# Patient Record
Sex: Female | Born: 1985 | Race: White | Hispanic: No | Smoking: Never smoker
Health system: Southern US, Community
[De-identification: ages and names within clinical notes are randomized; demographics above are authoritative.]

## PROBLEM LIST (undated history)

## (undated) DIAGNOSIS — F1320 Sedative, hypnotic or anxiolytic dependence, uncomplicated: Secondary | ICD-10-CM

## (undated) DIAGNOSIS — F419 Anxiety disorder, unspecified: Secondary | ICD-10-CM

## (undated) DIAGNOSIS — G43909 Migraine, unspecified, not intractable, without status migrainosus: Secondary | ICD-10-CM

## (undated) DIAGNOSIS — F840 Autistic disorder: Secondary | ICD-10-CM

## (undated) DIAGNOSIS — Z1589 Genetic susceptibility to other disease: Secondary | ICD-10-CM

## (undated) DIAGNOSIS — L309 Dermatitis, unspecified: Secondary | ICD-10-CM

## (undated) DIAGNOSIS — J45909 Unspecified asthma, uncomplicated: Secondary | ICD-10-CM

## (undated) DIAGNOSIS — T753XXA Motion sickness, initial encounter: Secondary | ICD-10-CM

## (undated) HISTORY — PX: COLONOSCOPY: SHX174

## (undated) HISTORY — PX: ESOPHAGOGASTRODUODENOSCOPY: SHX1529

---

## 2015-06-04 ENCOUNTER — Other Ambulatory Visit: Payer: Self-pay | Admitting: Family

## 2015-06-04 ENCOUNTER — Ambulatory Visit
Admission: RE | Admit: 2015-06-04 | Discharge: 2015-06-04 | Disposition: A | Discharge status: Home / Self Care | Payer: PRIVATE HEALTH INSURANCE | Source: Ambulatory Visit | Attending: Family | Admitting: Family

## 2015-06-04 DIAGNOSIS — M79674 Pain in right toe(s): Secondary | ICD-10-CM | POA: Diagnosis not present

## 2015-06-04 DIAGNOSIS — T1490XA Injury, unspecified, initial encounter: Secondary | ICD-10-CM

## 2016-03-13 ENCOUNTER — Encounter: Payer: Self-pay | Admitting: Physician Assistant

## 2016-03-13 ENCOUNTER — Ambulatory Visit: Payer: Self-pay | Admitting: Physician Assistant

## 2016-03-13 VITALS — BP 129/70 | HR 98 | Temp 99.1°F

## 2016-03-13 DIAGNOSIS — R112 Nausea with vomiting, unspecified: Secondary | ICD-10-CM

## 2016-03-13 DIAGNOSIS — B349 Viral infection, unspecified: Secondary | ICD-10-CM

## 2016-03-13 LAB — POCT URINALYSIS DIPSTICK
Bilirubin, UA: NEGATIVE
Glucose, UA: NEGATIVE
Ketones, UA: NEGATIVE
Leukocytes, UA: NEGATIVE
Nitrite, UA: NEGATIVE
PROTEIN UA: NEGATIVE
SPEC GRAV UA: 1.01
Urobilinogen, UA: 0.2
pH, UA: 6.5

## 2016-03-13 LAB — POCT URINE PREGNANCY: Preg Test, Ur: NEGATIVE

## 2016-03-13 MED ORDER — PROMETHAZINE HCL 25 MG PO TABS: 25.0000 mg | ORAL_TABLET | Freq: Three times a day (TID) | ORAL | Status: DC | PRN

## 2016-03-13 NOTE — Progress Notes (Signed)
S:  Pt c/o vomiting and nausea, sx for 6 days, ? fever/chills, no abd pain except for cramping with nausea; denies cp/sob, denies camping, bad food, recent antibiotics, or exposure to bad water Remainder ros neg  O:  Vitals wnl, nad, ENT wnl, neck supple no lymph, lungs c t a, cv rrr, abd soft nontender bs normal all 4 quads,  neuro intact, ua wnl, urine preg neg  A:  Viral gastroenteritis  P:  Reassurance, fluids, brat diet, rx phenergan 25mg  tid prn vomiting, return if not better in 3 days, return earlier if worsening

## 2016-04-24 ENCOUNTER — Ambulatory Visit: Payer: Self-pay | Admitting: Physician Assistant

## 2016-04-24 ENCOUNTER — Encounter: Payer: Self-pay | Admitting: Physician Assistant

## 2016-04-24 VITALS — BP 130/92 | HR 84 | Temp 98.6°F

## 2016-04-24 DIAGNOSIS — R5383 Other fatigue: Secondary | ICD-10-CM

## 2016-04-24 LAB — POCT URINALYSIS DIPSTICK
Bilirubin, UA: NEGATIVE
GLUCOSE UA: NEGATIVE
KETONES UA: NEGATIVE
Leukocytes, UA: NEGATIVE
Nitrite, UA: NEGATIVE
PROTEIN UA: NEGATIVE
SPEC GRAV UA: 1.015
Urobilinogen, UA: 0.2
pH, UA: 7.5

## 2016-04-24 LAB — POCT URINE PREGNANCY: Preg Test, Ur: NEGATIVE

## 2016-04-24 NOTE — Patient Instructions (Signed)
Fatigue  Fatigue is feeling tired all of the time, a lack of energy, or a lack of motivation. Occasional or mild fatigue is often a normal response to activity or life in general. However, long-lasting (chronic) or extreme fatigue may indicate an underlying medical condition.  HOME CARE INSTRUCTIONS   Watch your fatigue for any changes. The following actions may help to lessen any discomfort you are feeling:  · Talk to your health care provider about how much sleep you need each night. Try to get the required amount every night.  · Take medicines only as directed by your health care provider.  · Eat a healthy and nutritious diet. Ask your health care provider if you need help changing your diet.  · Drink enough fluid to keep your urine clear or pale yellow.  · Practice ways of relaxing, such as yoga, meditation, massage therapy, or acupuncture.  · Exercise regularly.    · Change situations that cause you stress. Try to keep your work and personal routine reasonable.  · Do not abuse illegal drugs.  · Limit alcohol intake to no more than 1 drink per day for nonpregnant women and 2 drinks per day for men. One drink equals 12 ounces of beer, 5 ounces of wine, or 1½ ounces of hard liquor.  · Take a multivitamin, if directed by your health care provider.  SEEK MEDICAL CARE IF:   · Your fatigue does not get better.  · You have a fever.    · You have unintentional weight loss or gain.  · You have headaches.    · You have difficulty:      Falling asleep.    Sleeping throughout the night.  · You feel angry, guilty, anxious, or sad.     · You are unable to have a bowel movement (constipation).    · You skin is dry.     · Your legs or another part of your body is swollen.    SEEK IMMEDIATE MEDICAL CARE IF:   · You feel confused.    · Your vision is blurry.  · You feel faint or pass out.    · You have a severe headache.    · You have severe abdominal, pelvic, or back pain.    · You have chest pain, shortness of breath, or an  irregular or fast heartbeat.    · You are unable to urinate or you urinate less than normal.    · You develop abnormal bleeding, such as bleeding from the rectum, vagina, nose, lungs, or nipples.  · You vomit blood.     · You have thoughts about harming yourself or committing suicide.    · You are worried that you might harm someone else.       This information is not intended to replace advice given to you by your health care provider. Make sure you discuss any questions you have with your health care provider.     Document Released: 09/07/2007 Document Revised: 12/01/2014 Document Reviewed: 03/14/2014  Elsevier Interactive Patient Education ©2016 [employer]

## 2016-04-24 NOTE — Progress Notes (Signed)
S: c/o being really tired and fatigued, doesn't want to get out of bed, states she upped her cymbalta but she's still tired, sx for about a month, lmp 03/22/16; states has irregular periods, no new activities or meds, no fever/chills/cp/sob, states she does see her psychiatrist tonight so will be able to ask her about the cympbalta  O: vitals wnl, nad, lungs c t a, cv rrr, urine preg neg, ua neg  A: fatigue  P: return for fasting labs, will do exec panel, vit d, b12, and ebv

## 2016-04-25 ENCOUNTER — Other Ambulatory Visit: Payer: Self-pay

## 2016-04-25 DIAGNOSIS — R5382 Chronic fatigue, unspecified: Secondary | ICD-10-CM

## 2016-04-26 LAB — CMP12+LP+TP+TSH+6AC+CBC/D/PLT
ALBUMIN: 4.2 g/dL (ref 3.5–5.5)
ALK PHOS: 90 IU/L (ref 39–117)
ALT: 18 IU/L (ref 0–32)
AST: 22 IU/L (ref 0–40)
Albumin/Globulin Ratio: 1.4 (ref 1.2–2.2)
BASOS: 1 %
BILIRUBIN TOTAL: 0.2 mg/dL (ref 0.0–1.2)
BUN / CREAT RATIO: 17 (ref 9–23)
BUN: 11 mg/dL (ref 6–20)
Basophils Absolute: 0 10*3/uL (ref 0.0–0.2)
CALCIUM: 9.2 mg/dL (ref 8.7–10.2)
CHLORIDE: 99 mmol/L (ref 96–106)
CREATININE: 0.63 mg/dL (ref 0.57–1.00)
Chol/HDL Ratio: 3.3 ratio units (ref 0.0–4.4)
Cholesterol, Total: 169 mg/dL (ref 100–199)
EOS (ABSOLUTE): 0.1 10*3/uL (ref 0.0–0.4)
EOS: 1 %
Free Thyroxine Index: 2 (ref 1.2–4.9)
GFR calc Af Amer: 138 mL/min/{1.73_m2} (ref >59)
GFR, EST NON AFRICAN AMERICAN: 120 mL/min/{1.73_m2} (ref >59)
GGT: 15 IU/L (ref 0–60)
GLUCOSE: 82 mg/dL (ref 65–99)
Globulin, Total: 2.9 g/dL (ref 1.5–4.5)
HDL: 51 mg/dL (ref >39)
HEMATOCRIT: 41.7 % (ref 34.0–46.6)
HEMOGLOBIN: 14.2 g/dL (ref 11.1–15.9)
IMMATURE GRANULOCYTES: 0 %
Immature Grans (Abs): 0 10*3/uL (ref 0.0–0.1)
Iron: 62 ug/dL (ref 27–159)
LDH: 156 IU/L (ref 119–226)
LDL CALC: 84 mg/dL (ref 0–99)
LYMPHS ABS: 2.8 10*3/uL (ref 0.7–3.1)
Lymphs: 34 %
MCH: 30.9 pg (ref 26.6–33.0)
MCHC: 34.1 g/dL (ref 31.5–35.7)
MCV: 91 fL (ref 79–97)
MONOS ABS: 0.7 10*3/uL (ref 0.1–0.9)
Monocytes: 9 %
NEUTROS ABS: 4.5 10*3/uL (ref 1.4–7.0)
Neutrophils: 55 %
POTASSIUM: 4.3 mmol/L (ref 3.5–5.2)
Phosphorus: 3.3 mg/dL (ref 2.5–4.5)
Platelets: 364 10*3/uL (ref 150–379)
RBC: 4.6 x10E6/uL (ref 3.77–5.28)
RDW: 13 % (ref 12.3–15.4)
SODIUM: 138 mmol/L (ref 134–144)
T3 Uptake Ratio: 24 % (ref 24–39)
T4, Total: 8.5 ug/dL (ref 4.5–12.0)
TOTAL PROTEIN: 7.1 g/dL (ref 6.0–8.5)
TSH: 3.24 u[IU]/mL (ref 0.450–4.500)
Triglycerides: 172 mg/dL — ABNORMAL HIGH (ref 0–149)
URIC ACID: 4.4 mg/dL (ref 2.5–7.1)
VLDL CHOLESTEROL CAL: 34 mg/dL (ref 5–40)
WBC: 8.1 10*3/uL (ref 3.4–10.8)

## 2016-04-26 LAB — EPSTEIN-BARR VIRUS VCA ANTIBODY PANEL
EBV Early Antigen Ab, IgG: <9 U/mL (ref 0.0–8.9)
EBV NA IGG: 120 U/mL — AB (ref 0.0–17.9)
EBV VCA IGG: 486 U/mL — AB (ref 0.0–17.9)

## 2016-04-26 LAB — VITAMIN D 25 HYDROXY (VIT D DEFICIENCY, FRACTURES): Vit D, 25-Hydroxy: 46.5 ng/mL (ref 30.0–100.0)

## 2016-04-29 ENCOUNTER — Ambulatory Visit: Payer: Self-pay | Admitting: Physician Assistant

## 2016-04-29 ENCOUNTER — Other Ambulatory Visit: Payer: Self-pay

## 2016-04-29 DIAGNOSIS — R5382 Chronic fatigue, unspecified: Secondary | ICD-10-CM

## 2016-04-29 NOTE — Progress Notes (Signed)
Patient came in to have blood drawn per her physician Dr. Threasa BeardsYang at [facility].  Blood was drawn from the right arm without any incident.  Pt wants results sent to Dr. Threasa BeardsYang when they are finalized.

## 2016-05-03 LAB — CORTISOL: CORTISOL: 7.5 ug/dL

## 2016-05-03 LAB — VITAMIN B12: Vitamin B-12: 455 pg/mL (ref 211–946)

## 2016-05-03 LAB — EHRLICHIA ANTIBODY PANEL
E. CHAFFEENSIS (HME) IGM TITER: NEGATIVE
E. CHAFFEENSIS IGG AB: NEGATIVE
HGE IgG Titer: NEGATIVE
HGE IgM Titer: NEGATIVE

## 2016-05-03 LAB — ESTRADIOL: ESTRADIOL: 53 pg/mL

## 2016-05-03 LAB — PROGESTERONE: PROGESTERONE: 2.1 ng/mL

## 2016-05-03 LAB — FSH/LH
FSH: 1.9 m[IU]/mL
LH: 5.1 m[IU]/mL

## 2016-05-03 LAB — B. BURGDORFI ANTIBODIES: Lyme IgG/IgM Ab: <0.91 {ISR} (ref 0.00–0.90)

## 2016-05-13 NOTE — Progress Notes (Signed)
Patient came in to have blood drawn for testing per Dr. Carie CaddyKaur's orders.

## 2016-05-20 ENCOUNTER — Ambulatory Visit: Payer: Self-pay | Admitting: Physician Assistant

## 2016-05-20 ENCOUNTER — Encounter: Payer: Self-pay | Admitting: Physician Assistant

## 2016-05-20 VITALS — BP 125/70 | HR 78 | Temp 97.7°F

## 2016-05-20 DIAGNOSIS — R1010 Upper abdominal pain, unspecified: Secondary | ICD-10-CM

## 2016-05-20 LAB — POCT URINALYSIS DIPSTICK
BILIRUBIN UA: NEGATIVE
GLUCOSE UA: NEGATIVE
KETONES UA: NEGATIVE
LEUKOCYTES UA: NEGATIVE
NITRITE UA: NEGATIVE
PH UA: 7
Protein, UA: NEGATIVE
Spec Grav, UA: 1.015
Urobilinogen, UA: 0.2

## 2016-05-20 LAB — POCT URINE PREGNANCY: Preg Test, Ur: NEGATIVE

## 2016-05-20 NOTE — Progress Notes (Signed)
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 and has some abdominal pain, was seen by her pcp, given a rx for an abdominal ultrasound to assess her spleen and ovaries, hadn't started her period at that time but since has, has noticed spotting again, doesn't use bcp, is taking her cymbalta 3x a day, has some GI problems, just has some diffuse tenderness and problems with abdomen, no v/d, some loose stools, no blood in stool, sx for about a month, would like for us to send her for the us  O: vitals wnl, nad, lungs c t a, cv rrr, abd soft a little tender in luq and across lower abd, bs normal all 4 quads, ua wnl, urine preg neg  A: abdominal pain  P: explained to patient that for continuity of care she should have her pcp order the us as she has already given her a rx for it, I feel that it is best considering her medications and fmla status that her pcp order this and f/u with her, pt is argumentative and upset, explained to her once again that continuity of care is best for her, that things are done in stages by her pcp, pt left upset

## 2016-06-02 NOTE — Addendum Note (Signed)
Addended by: Catha BrowEACON, MONIQUE T on: 06/02/2016 04:00 PM   Modules accepted: Orders

## 2016-06-22 IMAGING — CR DG TOE GREAT 2+V*R*
1 series · 3 of 3 positions shown · non-contrast
Comparison: None.

CLINICAL DATA: Pain and bruising to the great toe after a horse
stepped on the toe 2 days ago.

EXAM:
RIGHT GREAT TOE

[Series 1: dg toe great right · 0.14mm/px · 3 of 3 slices shown]
[im 1/3]
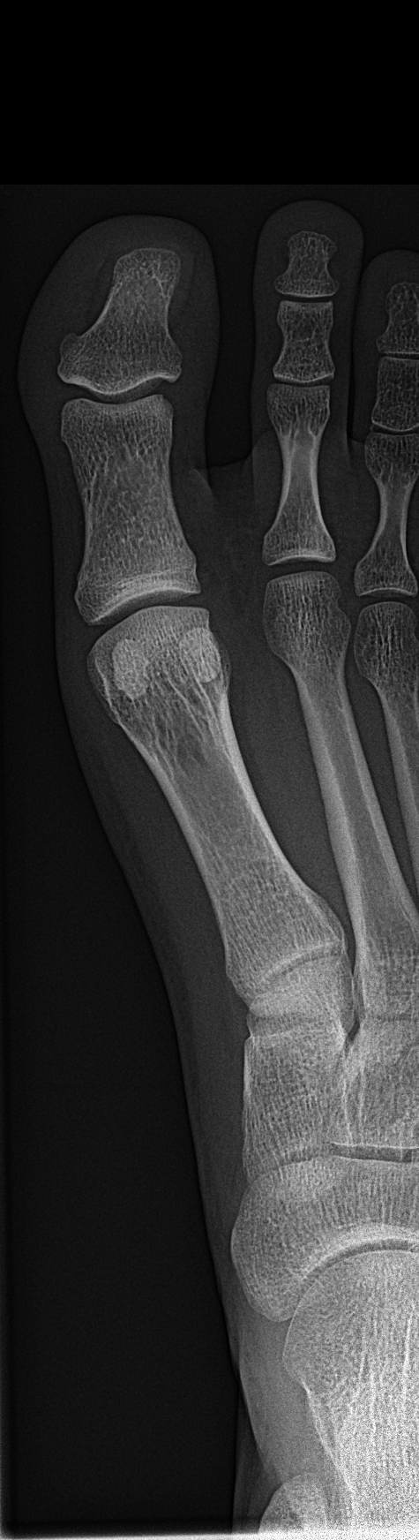
[im 2/3]
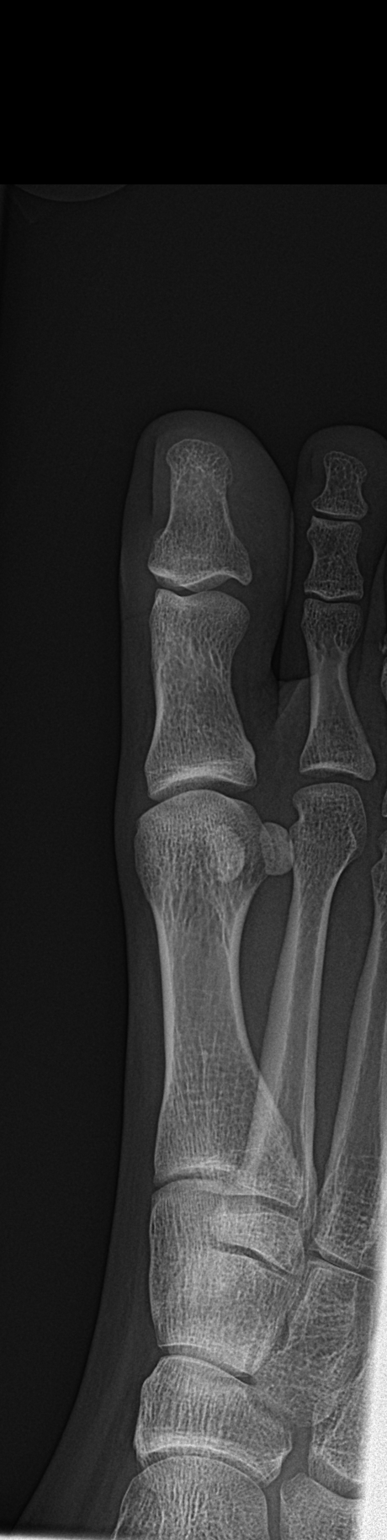
[im 3/3]
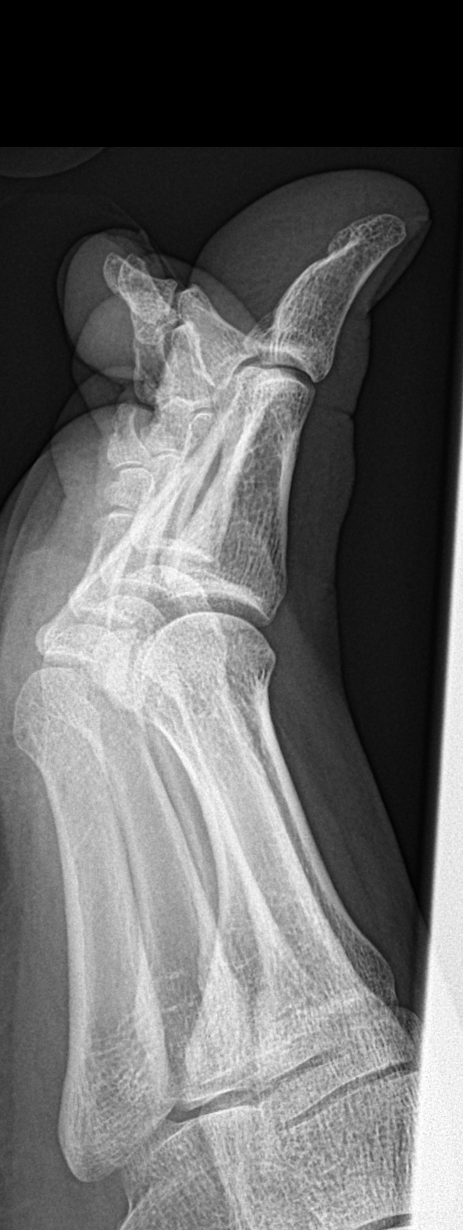

[3 of 3 positions shown; findings below may reference images not displayed]

FINDINGS: There is no evidence of fracture or dislocation. There is no
evidence of arthropathy or other focal bone abnormality. Soft
tissues are unremarkable.
IMPRESSION: Normal exam.

## 2016-10-10 ENCOUNTER — Ambulatory Visit: Payer: Self-pay | Admitting: Physician Assistant

## 2016-10-10 ENCOUNTER — Encounter: Payer: Self-pay | Admitting: Physician Assistant

## 2016-10-10 VITALS — BP 120/80 | HR 76 | Temp 99.2°F

## 2016-10-10 DIAGNOSIS — R509 Fever, unspecified: Secondary | ICD-10-CM

## 2016-10-10 LAB — POCT INFLUENZA A/B
Influenza A, POC: NEGATIVE
Influenza B, POC: NEGATIVE

## 2016-10-10 MED ORDER — BENZONATATE 200 MG PO CAPS: 200.0000 mg | ORAL_CAPSULE | Freq: Two times a day (BID) | ORAL | 0 refills | Status: DC | PRN

## 2016-10-10 MED ORDER — DOXYCYCLINE HYCLATE 100 MG PO TABS: 100.0000 mg | ORAL_TABLET | Freq: Two times a day (BID) | ORAL | 0 refills | Status: DC

## 2016-10-10 NOTE — Progress Notes (Signed)
S: C/o runny nose and congestion with dry cough for 5 days, + fever, chills, denies cp/sob, v/d; mucus was green this am but clear throughout the day, cough is sporadic,   Using otc meds: afrin and aleve  O: PE: vitals w low grade temp, otherwise normal, nad,  perrl eomi, normocephalic, tms dull, nasal mucosa red and swollen, throat injected, neck supple no lymph, lungs c t a, cv rrr, neuro intact, flu swab   A:  Acute flu like illness, bronchitis   P: drink fluids, continue regular meds , use otc meds of choice, return if not improving in 5 days, return earlier if worsening , doxy, tessalon perls

## 2016-10-21 ENCOUNTER — Encounter: Payer: Self-pay | Admitting: Physician Assistant

## 2016-10-21 ENCOUNTER — Ambulatory Visit: Payer: Self-pay | Admitting: Physician Assistant

## 2016-10-21 VITALS — BP 133/83 | HR 99 | Temp 98.4°F

## 2016-10-21 DIAGNOSIS — J069 Acute upper respiratory infection, unspecified: Secondary | ICD-10-CM

## 2016-10-21 DIAGNOSIS — J0100 Acute maxillary sinusitis, unspecified: Secondary | ICD-10-CM

## 2016-10-21 MED ORDER — CEFDINIR 300 MG PO CAPS: 300.0000 mg | ORAL_CAPSULE | Freq: Two times a day (BID) | ORAL | 0 refills | Status: DC

## 2016-10-21 NOTE — Progress Notes (Signed)
S: C/o runny nose and congestion for 3 days, no fever, chills, cp/sob, v/d; mucus was green this am but clear throughout the day, cough is sporadic, throat is sore, states she feels like the doxy helped but didn't make her well  Using otc meds:   O: PE: vitals wnl, nad, perrl eomi, normocephalic, tms dull, nasal mucosa red and swollen, throat injected, neck supple no lymph, lungs c t a, cv rrr, neuro intact  A:  Acute viral uri   P: drink fluids, continue regular meds , use otc meds of choice, return if not improving in 5 days, return earlier if worsening , if pt is worsening over next few days she is to use omnicef, take a probiotic 3x qd with antibiotic

## 2016-11-06 ENCOUNTER — Other Ambulatory Visit: Payer: Self-pay | Admitting: Physician Assistant

## 2016-11-06 DIAGNOSIS — Z299 Encounter for prophylactic measures, unspecified: Secondary | ICD-10-CM

## 2016-11-06 NOTE — Progress Notes (Signed)
Patient came in to have blood drawn for testing per Dr. Barnetta ChapelHelen Yang's authorization.

## 2016-11-07 ENCOUNTER — Other Ambulatory Visit: Payer: Self-pay | Admitting: Physician Assistant

## 2016-11-07 LAB — CBC WITH DIFFERENTIAL/PLATELET
BASOS: 1 %
Basophils Absolute: 0 10*3/uL (ref 0.0–0.2)
EOS (ABSOLUTE): 0.1 10*3/uL (ref 0.0–0.4)
EOS: 1 %
HEMATOCRIT: 41.5 % (ref 34.0–46.6)
HEMOGLOBIN: 13.9 g/dL (ref 11.1–15.9)
Immature Grans (Abs): 0 10*3/uL (ref 0.0–0.1)
Immature Granulocytes: 0 %
LYMPHS ABS: 2.5 10*3/uL (ref 0.7–3.1)
Lymphs: 30 %
MCH: 30.7 pg (ref 26.6–33.0)
MCHC: 33.5 g/dL (ref 31.5–35.7)
MCV: 92 fL (ref 79–97)
MONOCYTES: 7 %
Monocytes Absolute: 0.5 10*3/uL (ref 0.1–0.9)
NEUTROS ABS: 5.2 10*3/uL (ref 1.4–7.0)
Neutrophils: 61 %
Platelets: 343 10*3/uL (ref 150–379)
RBC: 4.53 x10E6/uL (ref 3.77–5.28)
RDW: 13 % (ref 12.3–15.4)
WBC: 8.4 10*3/uL (ref 3.4–10.8)

## 2016-11-07 LAB — COMPREHENSIVE METABOLIC PANEL
A/G RATIO: 1.8 (ref 1.2–2.2)
ALBUMIN: 4.3 g/dL (ref 3.5–5.5)
ALK PHOS: 77 IU/L (ref 39–117)
ALT: 15 IU/L (ref 0–32)
AST: 11 IU/L (ref 0–40)
BUN / CREAT RATIO: 21 (ref 9–23)
BUN: 13 mg/dL (ref 6–20)
Bilirubin Total: 0.2 mg/dL (ref 0.0–1.2)
CO2: 23 mmol/L (ref 18–29)
CREATININE: 0.61 mg/dL (ref 0.57–1.00)
Calcium: 8.9 mg/dL (ref 8.7–10.2)
Chloride: 102 mmol/L (ref 96–106)
GFR calc Af Amer: 139 mL/min/{1.73_m2} (ref >59)
GFR, EST NON AFRICAN AMERICAN: 120 mL/min/{1.73_m2} (ref >59)
GLOBULIN, TOTAL: 2.4 g/dL (ref 1.5–4.5)
Glucose: 118 mg/dL — ABNORMAL HIGH (ref 65–99)
Potassium: 4.1 mmol/L (ref 3.5–5.2)
SODIUM: 140 mmol/L (ref 134–144)
Total Protein: 6.7 g/dL (ref 6.0–8.5)

## 2016-11-10 LAB — STOOL CULTURE

## 2016-11-10 LAB — CLOSTRIDIUM DIFFICILE EIA: C difficile Toxins A+B, EIA: POSITIVE — AB

## 2016-11-10 LAB — PLEASE NOTE

## 2016-11-11 LAB — STOOL CULTURE: E COLI SHIGA TOXIN ASSAY: NEGATIVE

## 2016-11-11 LAB — CLOSTRIDIUM DIFFICILE EIA

## 2016-12-01 ENCOUNTER — Other Ambulatory Visit: Payer: Self-pay | Admitting: Physician Assistant

## 2016-12-02 ENCOUNTER — Other Ambulatory Visit: Payer: Self-pay

## 2016-12-02 NOTE — Progress Notes (Signed)
Patient came in to pick up a lab requisition form to take to [facility] to have a C-Diff stool culture testing per Dr. Renata CapriceYang's authorization.  Our office does not carry the stool container, nor do we store stool specimens in our office. When the test is resulted our I will send the results to Dr. Threasa BeardsYang per patient's request.

## 2016-12-04 LAB — CLOSTRIDIUM DIFFICILE EIA: C DIFFICILE TOXINS A+ B, EIA: NEGATIVE

## 2016-12-21 ENCOUNTER — Other Ambulatory Visit: Payer: Self-pay | Admitting: Physician Assistant

## 2016-12-26 LAB — C DIFFICILE, CYTOTOXIN B

## 2019-11-25 DIAGNOSIS — R000 Tachycardia, unspecified: Secondary | ICD-10-CM

## 2019-11-25 HISTORY — DX: Tachycardia, unspecified: R00.0

## 2020-02-02 ENCOUNTER — Ambulatory Visit: Admit: 2020-02-02 | Payer: BLUE CROSS/BLUE SHIELD | Admitting: Otolaryngology

## 2020-02-02 SURGERY — SEPTOPLASTY, NOSE
Anesthesia: General | Laterality: Right

## 2023-02-25 ENCOUNTER — Encounter: Payer: Self-pay | Admitting: Otolaryngology

## 2023-02-25 NOTE — Anesthesia Preprocedure Evaluation (Addendum)
Anesthesia Evaluation  Patient identified by MRN, date of birth, ID band Patient awake    Reviewed: Allergy & Precautions, H&P , NPO status , Patient's Chart, lab work & pertinent test results  History of Anesthesia Complications (+) PONV and history of anesthetic complications  Airway Mallampati: I  TM Distance: >3 FB Neck ROM: Full    Dental   Tooth #8, right upper central incisor :   Pulmonary neg pulmonary ROS, asthma  Snores and feels sleepy during the day, but is uncertain whether this is from sleep apnea or from benzodiazepines. She is tapering from benzodiazepines.  Patient plans to have sleep study, but will wait til she is well from this surgery. Her mother has sleep apnea.   Asthma is worsened by animal dander, heavy exercise, seasonal allergies, panic attacks. Auscultates clear now.   Pulmonary exam normal breath sounds clear to auscultation       Cardiovascular negative cardio ROS Normal cardiovascular exam+ dysrhythmias Supra Ventricular Tachycardia  Rhythm:Regular Rate:Normal     Neuro/Psych  Headaches PSYCHIATRIC DISORDERS Anxiety Depression    Benzodiazepine taper Panic attacks hx  OCDPanic attacks autism negative neurological ROS  negative psych ROS   GI/Hepatic Neg liver ROS,GERD  Controlled,,Hx C diff several years ago   Endo/Other  negative endocrine ROS    Renal/GU negative Renal ROS  negative genitourinary   Musculoskeletal negative musculoskeletal ROS (+)  Reports occasional right sternocleidomastoid spasm For which she uses benadryl and aleve   Abdominal   Peds negative pediatric ROS (+)  Hematology negative hematology ROS (+)   Anesthesia Other Findings MTHFR mutation  Migraines Anxiety Depression OCD  Tachycardia Asthma  Seasonal allergies Benzodiazepine dependence Eczema  Autism Motion sickness  SVT   Note: no tylenol; patient reports unknown reaction to tylenol   Reproductive/Obstetrics negative OB ROS Finishing LMP now                               Anesthesia Physical Anesthesia Plan  ASA: 3  Anesthesia Plan: General ETT   Post-op Pain Management:    Induction: Intravenous  PONV Risk Score and Plan:   Airway Management Planned: Oral ETT  Additional Equipment:   Intra-op Plan:   Post-operative Plan: Extubation in OR  Informed Consent: I have reviewed the patients History and Physical, chart, labs and discussed the procedure including the risks, benefits and alternatives for the proposed anesthesia with the patient or authorized representative who has indicated his/her understanding and acceptance.     Dental Advisory Given  Plan Discussed with: Anesthesiologist, CRNA and Surgeon  Anesthesia Plan Comments: (Patient consented for risks of anesthesia including but not limited to:  - adverse reactions to medications - damage to eyes, teeth, lips or other oral mucosa - nerve damage due to positioning  - sore throat or hoarseness - Damage to heart, brain, nerves, lungs, other parts of body or loss of life  Patient voiced understanding.)       Anesthesia Quick Evaluation

## 2023-02-26 NOTE — Discharge Instructions (Signed)
[facility] ENDOSCOPIC SINUS SURGERY [facility]  What is Functional Endoscopic Sinus Surgery?  The Surgery involves making the natural openings of the sinuses larger by removing the bony partitions that separate the sinuses from the nasal cavity.  The natural sinus lining is preserved as much as possible to allow the sinuses to resume normal function after the surgery.  In some patients nasal polyps (excessively swollen lining of the sinuses) may be removed to relieve obstruction of the sinus openings.  The surgery is performed through the nose using lighted scopes, which eliminates the need for incisions on the face.  A septoplasty is a different procedure which is sometimes performed with sinus surgery.  It involves straightening the boy partition that separates the two sides of your nose.  A crooked or deviated septum may need repair if is obstructing the sinuses or nasal airflow.  Turbinate reduction is also often performed during sinus surgery.  The turbinates are bony proturberances from the side walls of the nose which swell and can obstruct the nose in patients with sinus and allergy problems.  Their size can be surgically reduced to help relieve nasal obstruction.  What Can Sinus Surgery Do For Me?  Sinus surgery can reduce the frequency of sinus infections requiring antibiotic treatment.  This can provide improvement in nasal congestion, post-nasal drainage, facial pressure and nasal obstruction.  Surgery will NOT prevent you from ever having an infection again, so it usually only for patients who get infections 4 or more times yearly requiring antibiotics, or for infections that do not clear with antibiotics.  It will not cure nasal allergies, so patients with allergies may still require medication to treat their allergies after surgery. Surgery may improve headaches related to sinusitis, however, some people will continue to  require medication to control sinus headaches related to allergies.  Surgery will do nothing for other forms of headache (migraine, tension or cluster).  What Are the Risks of Endoscopic Sinus Surgery?  Current techniques allow surgery to be performed safely with little risk, however, there are rare complications that patients should be aware of.  Because the sinuses are located around the eyes, there is risk of eye injury, including blindness, though again, this would be quite rare. This is usually a result of bleeding behind the eye during surgery, which can effect vision, though there are treatments to protect the vision and prevent permanent injury. More serious complications would include bleeding inside the brain cavity or damage to the brain.This happens when the fluid around the brain leaks out into the sinus cavity.  Again, all of these complications are uncommon, and spinal fluid leaks can be safely managed surgically if they occur.  The most common complication of sinus surgery is bleeding from the nose, which may require packing or cauterization of the nose.  Patients with polyps may experience recurrence of the polyps that would require revision surgery.  Alterations of sense of smell or injury to the tear ducts are also rare complications.   What is the Surgery Like, and what is the Recovery?  The Surgery usually takes a couple of hours to perform, and is usually performed under a general anesthetic (completely asleep).  Patients are usually discharged home after a couple of hours.  Sometimes during surgery it is necessary to pack the nose to control bleeding, and the packing is left in place for 24 - 48 hours, and removed by your surgeon.  If   a septoplasty was performed during the procedure, there is often a splint placed which must be removed after 5-7 days.   Discomfort: Pain is usually mild to moderate, and can be controlled by prescription pain medication or acetaminophen (Tylenol).   Aspirin, Ibuprofen (Advil, Motrin), or Naprosyn (Aleve) should be avoided, as they can cause increased bleeding.  Most patients feel sinus pressure like they have a bad head cold for several days.  Sleeping with your head elevated can help reduce swelling and facial pressure, as can ice packs over the face.  A humidifier may be helpful to keep the mucous and blood from drying in the nose.   Diet: There are no specific diet restrictions, however, you should generally start with clear liquids and a light diet of bland foods because the anesthetic can cause some nausea.  Advance your diet depending on how your stomach feels.  Taking your pain medication with food will often help reduce stomach upset which pain medications can cause.  Nasal Saline Irrigation: It is important to remove blood clots and dried mucous from the nose as it is healing.  This is done by having you irrigate the nose at least 3 - 4 times daily with a salt water solution.  We recommend using NeilMed Sinus Rinse (available at the drug store).  Fill the squeeze bottle with the solution, bend over a sink, and insert the tip of the squeeze bottle into the nose  of an inch.  Point the tip of the squeeze bottle towards the inside corner of the eye on the same side your irrigating.  Squeeze the bottle and gently irrigate the nose.  If you bend forward as you do this, most of the fluid will flow back out of the nose, instead of down your throat.   The solution should be warm, near body temperature, when you irrigate.   Each time you irrigate, you should use a full squeeze bottle.   Note that if you are instructed to use Nasal Steroid Sprays at any time after your surgery, irrigate with saline BEFORE using the steroid spray, so you do not wash it all out of the nose. Another product, Nasal Saline Gel (such as AYR Nasal Saline Gel) can be applied in each nostril 3 - 4 times daily to moisture the nose and reduce scabbing or crusting.  Bleeding:   Bloody drainage from the nose can be expected for several days, and patients are instructed to irrigate their nose frequently with salt water to help remove mucous and blood clots.  The drainage may be dark red or brown, though some fresh blood may be seen intermittently, especially after irrigation.  Do not blow you nose, as bleeding may occur. If you must sneeze, keep your mouth open to allow air to escape through your mouth.  If heavy bleeding occurs: Irrigate the nose with saline to rinse out clots, then spray the nose 3 - 4 times with Afrin Nasal Decongestant Spray.  The spray will constrict the blood vessels to slow bleeding.  Pinch the lower half of your nose shut to apply pressure, and lay down with your head elevated.  Ice packs over the nose may help as well. If bleeding persists despite these measures, you should notify your doctor.  Do not use the Afrin routinely to control nasal congestion after surgery, as it can result in worsening congestion and may affect healing.     Activity: Return to work varies among patients. Most patients will be out   of work at least 5 - 7 days to recover.  Patient may return to work after they are off of narcotic pain medication, and feeling well enough to perform the functions of their job.  Patients must avoid heavy lifting (over 10 pounds) or strenuous physical for 2 weeks after surgery, so your employer may need to assign you to light duty, or keep you out of work longer if light duty is not possible.  NOTE: you should not drive, operate dangerous machinery, do any mentally demanding tasks or make any important legal or financial decisions while on narcotic pain medication and recovering from the general anesthetic.    Call Your Doctor Immediately if You Have Any of the Following: Bleeding that you cannot control with the above measures Loss of vision, double vision, bulging of the eye or black eyes. Fever over 101 degrees Neck stiffness with severe headache,  fever, nausea and change in mental state. You are always encouraged to call anytime with concerns, however, please call with requests for pain medication refills during office hours.  Office Endoscopy: During follow-up visits your doctor will remove any packing or splints that may have been placed and evaluate and clean your sinuses endoscopically.  Topical anesthetic will be used to make this as comfortable as possible, though you may want to take your pain medication prior to the visit.  How often this will need to be done varies from patient to patient.  After complete recovery from the surgery, you may need follow-up endoscopy from time to time, particularly if there is concern of recurrent infection or nasal polyps.  

## 2023-03-05 ENCOUNTER — Ambulatory Visit: Payer: 59 | Admitting: Anesthesiology

## 2023-03-05 ENCOUNTER — Encounter: Payer: Self-pay | Admitting: Otolaryngology

## 2023-03-05 ENCOUNTER — Other Ambulatory Visit: Payer: Self-pay

## 2023-03-05 ENCOUNTER — Ambulatory Visit
Admission: RE | Admit: 2023-03-05 | Discharge: 2023-03-05 | Disposition: A | Discharge status: Home / Self Care | Payer: 59 | Attending: Otolaryngology | Admitting: Otolaryngology

## 2023-03-05 ENCOUNTER — Encounter
Admission: RE | Disposition: A | Discharge status: Home / Self Care | Payer: Self-pay | Source: Home / Self Care | Attending: Otolaryngology

## 2023-03-05 DIAGNOSIS — F410 Panic disorder [episodic paroxysmal anxiety] without agoraphobia: Secondary | ICD-10-CM | POA: Diagnosis not present

## 2023-03-05 DIAGNOSIS — J45909 Unspecified asthma, uncomplicated: Secondary | ICD-10-CM | POA: Diagnosis not present

## 2023-03-05 DIAGNOSIS — J328 Other chronic sinusitis: Secondary | ICD-10-CM | POA: Insufficient documentation

## 2023-03-05 DIAGNOSIS — J343 Hypertrophy of nasal turbinates: Secondary | ICD-10-CM | POA: Insufficient documentation

## 2023-03-05 DIAGNOSIS — J3489 Other specified disorders of nose and nasal sinuses: Secondary | ICD-10-CM | POA: Insufficient documentation

## 2023-03-05 DIAGNOSIS — Z79899 Other long term (current) drug therapy: Secondary | ICD-10-CM | POA: Diagnosis not present

## 2023-03-05 DIAGNOSIS — I471 Supraventricular tachycardia, unspecified: Secondary | ICD-10-CM | POA: Insufficient documentation

## 2023-03-05 DIAGNOSIS — J342 Deviated nasal septum: Secondary | ICD-10-CM | POA: Diagnosis present

## 2023-03-05 DIAGNOSIS — F840 Autistic disorder: Secondary | ICD-10-CM | POA: Diagnosis not present

## 2023-03-05 DIAGNOSIS — F32A Depression, unspecified: Secondary | ICD-10-CM | POA: Diagnosis not present

## 2023-03-05 HISTORY — DX: Migraine, unspecified, not intractable, without status migrainosus: G43.909

## 2023-03-05 HISTORY — DX: Autistic disorder: F84.0

## 2023-03-05 HISTORY — DX: Unspecified asthma, uncomplicated: J45.909

## 2023-03-05 HISTORY — PX: NASAL TURBINATE REDUCTION: SHX2072

## 2023-03-05 HISTORY — DX: Dermatitis, unspecified: L30.9

## 2023-03-05 HISTORY — DX: Sedative, hypnotic or anxiolytic dependence, uncomplicated: F13.20

## 2023-03-05 HISTORY — PX: ENDOSCOPIC CONCHA BULLOSA RESECTION: SHX6395

## 2023-03-05 HISTORY — DX: Anxiety disorder, unspecified: F41.9

## 2023-03-05 HISTORY — DX: Motion sickness, initial encounter: T75.3XXA

## 2023-03-05 HISTORY — PX: SEPTOPLASTY: SHX2393

## 2023-03-05 HISTORY — DX: Genetic susceptibility to other disease: Z15.89

## 2023-03-05 SURGERY — SEPTOPLASTY, NOSE
Anesthesia: General | Site: Nose | Laterality: Right

## 2023-03-05 MED ORDER — SUCCINYLCHOLINE CHLORIDE 200 MG/10ML IV SOSY
PREFILLED_SYRINGE | INTRAVENOUS | Status: DC | PRN
  Administered 2023-03-05: 100 mg via INTRAVENOUS

## 2023-03-05 MED ORDER — PROPOFOL 10 MG/ML IV BOLUS
INTRAVENOUS | Status: DC | PRN
  Administered 2023-03-05: 200 mg via INTRAVENOUS

## 2023-03-05 MED ORDER — LACTATED RINGERS IV SOLN: INTRAVENOUS | Status: DC

## 2023-03-05 MED ORDER — DEXMEDETOMIDINE HCL IN NACL 80 MCG/20ML IV SOLN
INTRAVENOUS | Status: DC | PRN
  Administered 2023-03-05 (×2): 8 ug via BUCCAL

## 2023-03-05 MED ORDER — EPHEDRINE SULFATE (PRESSORS) 50 MG/ML IJ SOLN
INTRAMUSCULAR | Status: DC | PRN
  Administered 2023-03-05 (×3): 10 mg via INTRAVENOUS

## 2023-03-05 MED ORDER — DEXAMETHASONE SODIUM PHOSPHATE 4 MG/ML IJ SOLN
INTRAMUSCULAR | Status: DC | PRN
  Administered 2023-03-05: 8 mg via INTRAVENOUS

## 2023-03-05 MED ORDER — DIPHENHYDRAMINE HCL 12.5 MG/5ML PO ELIX: 25.0000 mg | ORAL_SOLUTION | Freq: Once | ORAL | Status: DC

## 2023-03-05 MED ORDER — TRAMADOL HCL 50 MG PO TABS
50.0000 mg | ORAL_TABLET | Freq: Once | ORAL | Status: AC
  Administered 2023-03-05: 50 mg via ORAL

## 2023-03-05 MED ORDER — ONDANSETRON HCL 4 MG/2ML IJ SOLN
INTRAMUSCULAR | Status: DC | PRN
  Administered 2023-03-05: 4 mg via INTRAVENOUS

## 2023-03-05 MED ORDER — LIDOCAINE HCL (CARDIAC) PF 100 MG/5ML IV SOSY
PREFILLED_SYRINGE | INTRAVENOUS | Status: DC | PRN
  Administered 2023-03-05: 30 mg via INTRAVENOUS

## 2023-03-05 MED ORDER — OXYCODONE HCL 5 MG PO TABS: 5.0000 mg | ORAL_TABLET | Freq: Once | ORAL | Status: DC

## 2023-03-05 MED ORDER — LIDOCAINE-EPINEPHRINE 1 %-1:100000 IJ SOLN
INTRAMUSCULAR | Status: DC | PRN
  Administered 2023-03-05: 7 mL

## 2023-03-05 MED ORDER — PHENYLEPHRINE HCL 0.5 % NA SOLN
NASAL | Status: DC | PRN
  Administered 2023-03-05: 30 mL via TOPICAL

## 2023-03-05 MED ORDER — FENTANYL CITRATE (PF) 100 MCG/2ML IJ SOLN
INTRAMUSCULAR | Status: DC | PRN
  Administered 2023-03-05 (×2): 50 ug via INTRAVENOUS

## 2023-03-05 SURGICAL SUPPLY — 30 items
CANISTER SUCT 1200ML W/VALVE (MISCELLANEOUS) ×3 IMPLANT
CATH IV 18X1 1/4 SAFELET (CATHETERS) ×3 IMPLANT
COAGULATOR SUCT 8FR VV (MISCELLANEOUS) ×3 IMPLANT
DRAPE HEAD BAR (DRAPES) ×3 IMPLANT
ELECT REM PT RETURN 9FT ADLT (ELECTROSURGICAL) ×3
ELECTRODE REM PT RTRN 9FT ADLT (ELECTROSURGICAL) ×3 IMPLANT
GAUZE SPONGE 4X4 12PLY STRL (GAUZE/BANDAGES/DRESSINGS) IMPLANT
GLOVE SURG GAMMEX PI TX LF 7.5 (GLOVE) ×6 IMPLANT
GOWN STRL REUS W/ TWL LRG LVL3 (GOWN DISPOSABLE) ×3 IMPLANT
GOWN STRL REUS W/TWL LRG LVL3 (GOWN DISPOSABLE) ×3
IV CATH 18X1 1/4 SAFELET (CATHETERS) ×3
KIT TURNOVER KIT A (KITS) ×3 IMPLANT
NDL ANESTHESIA 27G X 3.5 (NEEDLE) ×3 IMPLANT
NDL HYPO 27GX1-1/4 (NEEDLE) ×3 IMPLANT
NEEDLE ANESTHESIA  27G X 3.5 (NEEDLE) ×3
NEEDLE ANESTHESIA 27G X 3.5 (NEEDLE) ×3 IMPLANT
NEEDLE HYPO 27GX1-1/4 (NEEDLE) ×3 IMPLANT
PACK ENT CUSTOM (PACKS) ×3 IMPLANT
PACKING NASAL EPIS 4X2.4 XEROG (MISCELLANEOUS) ×3 IMPLANT
PATTIES SURGICAL .5 X3 (DISPOSABLE) ×3 IMPLANT
SPLINT NASAL SEPTAL BLV .50 ST (MISCELLANEOUS) ×3 IMPLANT
STRAP BODY AND KNEE 60X3 (MISCELLANEOUS) ×6 IMPLANT
SUT CHROMIC 3-0 (SUTURE)
SUT CHROMIC 3-0 KS 27XMFL CR (SUTURE)
SUT ETHILON 3-0 KS 30 BLK (SUTURE) ×3 IMPLANT
SUT PLAIN GUT 4-0 (SUTURE) ×3 IMPLANT
SUTURE CHRMC 3-0 KS 27XMFL CR (SUTURE) IMPLANT
SYR 3ML LL SCALE MARK (SYRINGE) ×3 IMPLANT
TOWEL OR 17X26 4PK STRL BLUE (TOWEL DISPOSABLE) ×3 IMPLANT
WATER STERILE IRR 250ML POUR (IV SOLUTION) ×3 IMPLANT

## 2023-03-05 NOTE — Anesthesia Procedure Notes (Addendum)
Procedure Name: Intubation Date/Time: 03/05/2023 10:31 AM  Performed by: Andee Poles, CRNAPre-anesthesia Checklist: Patient identified, Emergency Drugs available, Suction available, Patient being monitored and Timeout performed Patient Re-evaluated:Patient Re-evaluated prior to induction Oxygen Delivery Method: Circle system utilized Preoxygenation: Pre-oxygenation with 100% oxygen Induction Type: IV induction Ventilation: Mask ventilation without difficulty Laryngoscope Size: Mac and 3 Grade View: Grade I Tube type: Oral Rae Tube size: 7.0 mm Number of attempts: 1 Placement Confirmation: ETT inserted through vocal cords under direct vision, positive ETCO2 and breath sounds checked- equal and bilateral Tube secured with: Tape Dental Injury: Teeth and Oropharynx as per pre-operative assessment

## 2023-03-05 NOTE — Transfer of Care (Signed)
Immediate Anesthesia Transfer of Care Note  Patient: Jenna Cohen  Procedure(s) Performed: SEPTOPLASTY (Nose) INFERIOR TURBINATE REDUCTION (Bilateral: Nose) ENDOSCOPIC CONCHA BULLOSA RESECTION (Right: Nose)  Patient Location: PACU  Anesthesia Type: General ETT  Level of Consciousness: awake, alert  and patient cooperative  Airway and Oxygen Therapy: Patient Spontanous Breathing and Patient connected to supplemental oxygen  Post-op Assessment: Post-op Vital signs reviewed, Patient's Cardiovascular Status Stable, Respiratory Function Stable, Patent Airway and No signs of Nausea or vomiting  Post-op Vital Signs: Reviewed and stable  Complications: No notable events documented.

## 2023-03-05 NOTE — Anesthesia Postprocedure Evaluation (Signed)
Anesthesia Post Note  Patient: Jenna Cohen  Procedure(s) Performed: SEPTOPLASTY (Nose) INFERIOR TURBINATE REDUCTION (Bilateral: Nose) ENDOSCOPIC CONCHA BULLOSA RESECTION (Right: Nose)  Patient location during evaluation: PACU Anesthesia Type: General Level of consciousness: awake and alert Pain management: pain level controlled Vital Signs Assessment: post-procedure vital signs reviewed and stable Respiratory status: spontaneous breathing, nonlabored ventilation, respiratory function stable and patient connected to nasal cannula oxygen Cardiovascular status: blood pressure returned to baseline and stable Postop Assessment: no apparent nausea or vomiting Anesthetic complications: no   No notable events documented.   Last Vitals:  Vitals:   03/05/23 1225 03/05/23 1230  BP:  (!) 158/97  Pulse: 75 82  Resp: 10 12  Temp:  36.6 C  SpO2: 96% 96%    Last Pain:  Vitals:   03/05/23 1230  TempSrc:   PainSc: 4                  [name]

## 2023-03-05 NOTE — Op Note (Signed)
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  The right middle turbinate was very enlarged with a concha bullosa in the right inferior turbinate was very enlarged.  The left inferior turbinate was less enlarged.  The left middle turbinate was lateralized into the middle meatus  Procedure: With the patient in a comfortable supine position,  general orotracheal anesthesia was induced without difficulty.     The patient received preoperative Afrin spray for topical decongestion and vasoconstriction.  Intravenous prophylactic antibiotics were administered.  At an appropriate level, the patient was placed in a semi-sitting position.  Nasal vibrissae were trimmed.   1% Xylocaine with 1:100,000 epinephrine, 7 cc's, was infiltrated into the anterior floor of the nose, into the nasal spine region, into the membranous columella, and finally into the submucoperichondrial plane of the septum on both sides.  Several minutes were allowed for this to take effect.  Cottoniod pledgetts soaked in Afrin and 4% Xylocaine were placed into both nasal cavities and left while the patient was prepped and draped in the standard fashion.  The materials were removed from the nose and observed to be intact and correct in number.  The nose was inspected with a headlight and zero degree scope with the findings as described above.  A left Killian incision was sharply executed and carried down to the quadrangular cartilage. The  mucoperichondrium was elelvated along the quadrangular plate back to the bony-cartilaginous junction. The mucoperiostium was then elevated along the ethmoid plate and the vomer. The boney-catilaginous junction was then split with a freer elevator and the mucoperiosteum was elevated on the opposite side. The mucoperiosteum was then elevated along the maxillary crest as needed to expose the crooked bone of the crest.  Boney spurs of the vomer and maxillary crest were removed with Lenoria Chime forceps.  The cartilaginous plate was trimmed along its posterior and inferior borders of about 2 mm of cartilage to free it up inferiorly. Some of the deviated ethmoid plate was then fractured and removed with Takahashi forceps to free up the posterior border of the quadrangular plate and allow it to swing back to the midline. The mucosal flaps were placed back into their anatomic position to allow visualization of the airways. The septum now sat in the midline with an improved airway.  A 3-0 Chromic suture on a Keith needle in used to anchor the inferior septum at the nasal spine with a through and through suture. The mucosal flaps are then sutured together using a through and through whip stitch of 4-0 Plain Gut with a mini-Keith needle. This was used to close the Leola incision as well.   The inferior turbinates were then inspected. An incision was created along the inferior aspect of the left inferior turbinate with removal of some of the inferior soft tissue and bone. Electrocautery was used to control bleeding in the area. The remaining turbinate was then outfractured to open up the airway further. There was no significant bleeding noted. The right turbinate was then trimmed  and outfractured in a similar fashion.  The 0 degree scope was used to visualize the middle turbinates.  The right middle turbinate was very enlarged and a Grunewald forceps was used to trim some of the anterior superior border of the turbinate.   Broke into a large concha bullosa.  I trimmed the lateral wall of the concha bullosa to remove that and leave more of a J shaped turbinate.  The anterior portion was remodeled to smooth it out.  Xerogel was then placed in the middle meatus and along the middle turbinate remnant to help control bleeding.  The left side was then visualized with the 0 degree scope and the middle turbinate was seen lateralized from the very deviated septum that side.  The septum was now straight and the middle turbinate was infractured to open up the middle meatus.  Some xerogel was placed into the middle meatus to help hold this medially.  The airways were then visualized and showed open passageways on both sides that were significantly improved compared to before surgery. There was no signifcant bleeding. Nasal splints were applied to both sides of the septum using Xomed 0.74mm regular sized splints that were trimmed, and then held in position with a 3-0 Nylon through and through suture.  The patient was turned back over to anesthesia, and awakened, extubated, and taken to the PACU in satisfactory condition.  Dispo:   PACU to home  Plan: Ice, elevation, narcotic analgesia, steroid taper, and prophylactic antibiotics for the duration of indwelling nasal foreign bodies.  We will reevaluate the patient in the office in 6 days and remove the septal splints.  Return to work in 10 days, strenuous activities in two weeks.   Beverly Sessions Yatzary Merriweather 03/05/2023 11:38 AM

## 2023-03-05 NOTE — H&P (Signed)
H&P has been reviewed and patient reevaluated, no changes necessary. To be downloaded later.  

## 2024-06-21 ENCOUNTER — Other Ambulatory Visit: Payer: Self-pay | Admitting: Otolaryngology

## 2024-06-21 DIAGNOSIS — M26601 Right temporomandibular joint disorder, unspecified: Secondary | ICD-10-CM

## 2024-06-22 ENCOUNTER — Encounter: Payer: Self-pay | Admitting: Otolaryngology

## 2024-06-23 ENCOUNTER — Telehealth: Payer: Self-pay | Admitting: Neurosurgery

## 2024-06-23 NOTE — Telephone Encounter (Signed)
 We received a referral for this patient and she requested to only see you.   She states that her referring provider recommended you, Dr.Jeungel.  Patient has not had any updated imaging, physical therapy, or steroid injections since 2020 due to other health concerns she has had. I did inform the patient I can ask the provider but there may not be a guarantee due to these things.  Provider comments on referral: chronic neck pain; evaluate and discuss further treatment of her cervical spine as well as her ilio-sacral issues

## 2024-06-28 ENCOUNTER — Ambulatory Visit
Admission: RE | Admit: 2024-06-28 | Discharge: 2024-06-28 | Disposition: A | Discharge status: Home / Self Care | Source: Ambulatory Visit | Attending: Otolaryngology | Admitting: Otolaryngology

## 2024-06-28 DIAGNOSIS — M26601 Right temporomandibular joint disorder, unspecified: Secondary | ICD-10-CM

## 2024-07-05 NOTE — Progress Notes (Signed)
 Referring Physician:  Edda Mt, MD [address] [place],  [place] [address]  Primary Physician:  Diona Floss, NP  History of Present Illness: 07/07/2024 Ms. Jenna Cohen is here today with a chief complaint of chronic neck pain and sacroiliac pain who presents for evaluation of these conditions.   She has been dealing with chronic neck pain has persisted for over ten years, primarily spasmodic and located on the right side of the neck. It often intertwines with jaw pain and can trigger migraines. There is no radiation of pain down the arms, but there is a history of overuse tendinitis in the right hand. She is tapering off Klonopin.  Chronic right-sided sacroiliac pain is constant. Cortisone injections were administered in 2020, but she cannot currently use steroids. She manages the pain with Aleve and stretching.  Numbness is present in the right big toe at timesShe lives alone and manages her pain with over-the-counter medications and lifestyle adjustments. Bowel/Bladder Dysfunction: none  Conservative measures: chiropractor, massage Physical therapy: has not participated in recently Multimodal medical therapy including regular antiinflammatories:  Robaxin, Aleve Injections:  no epidural steroid injections  Past Surgery: no neck or back surgery  Jenna Cohen has no symptoms of cervical myelopathy.  The symptoms are causing a significant impact on the patient's life.   I have utilized the care everywhere function in epic to review the outside records available from external health systems.   Discussed the use of AI scribe software for clinical note transcription with the patient, who gave verbal consent to proceed.       Review of Systems:  A 10 point review of systems is negative, except for the pertinent positives and negatives detailed in the HPI.  Past Medical History: Past Medical History:  Diagnosis Date   Anxiety    Asthma    Autism     High functioning   Benzodiazepine dependence (HCC)    working on tapering   Eczema    Migraines    secondary to muscle spasms from benzo taper   Motion sickness    spinning motion   MTHFR mutation    Tachycardia 2021   associated with benzo taper    Past Surgical History: Past Surgical History:  Procedure Laterality Date   COLONOSCOPY     ENDOSCOPIC CONCHA BULLOSA RESECTION Right 03/05/2023   Procedure: ENDOSCOPIC CONCHA BULLOSA RESECTION;  Surgeon: Edda Mt, MD;  Location: [facility];  Service: ENT;  Laterality: Right;   ESOPHAGOGASTRODUODENOSCOPY     NASAL TURBINATE REDUCTION Bilateral 03/05/2023   Procedure: INFERIOR TURBINATE REDUCTION;  Surgeon: Edda Mt, MD;  Location: [facility];  Service: ENT;  Laterality: Bilateral;   SEPTOPLASTY N/A 03/05/2023   Procedure: COLENE;  Surgeon: Edda Mt, MD;  Location: [facility];  Service: ENT;  Laterality: N/A;    Allergies: Allergies as of 07/07/2024 - Review Complete 03/05/2023  Allergen Reaction Noted   Gabapentin Shortness Of Breath 03/13/2016   Moxifloxacin Shortness Of Breath 03/13/2016   Shellfish allergy Shortness Of Breath 02/25/2023   Acetaminophen  02/25/2023   Celecoxib  02/25/2023   Flurbiprofen  02/25/2023   Fosphenytoin  02/25/2023   Ibuprofen  02/25/2023   Mefenamic acid  02/25/2023   Meloxicam  02/25/2023   Other  02/25/2023   Phenytoin  02/25/2023   Piroxicam  02/25/2023   Sudafed [pseudoephedrine hcl]  10/10/2016   Sulfa antibiotics Other (See Comments) 03/13/2016   Sulfasalazine  02/25/2023   Azithromycin Nausea And Vomiting  03/13/2016   Metronidazole Nausea And Vomiting 03/13/2016   Tape Rash 02/25/2023    Medications:  Current Outpatient Medications:    Budesonide (RHINOCORT ALLERGY NA), Place into the nose daily., Disp: , Rfl:    cetirizine (ZYRTEC) 10 MG tablet, Take 10 mg by mouth., Disp: , Rfl:    Cholecalciferol (D 1000) 1000 units capsule, Take  5,000 Units by mouth at bedtime., Disp: , Rfl:    clonazePAM (KLONOPIN) 1 MG tablet, Take 0.28 mg by mouth 2 (two) times daily., Disp: , Rfl:    Cranberry 500 MG TABS, Take by mouth at bedtime., Disp: , Rfl:    diphenhydrAMINE  (BENADRYL ) 25 mg capsule, Take 50 mg by mouth every 6 (six) hours as needed., Disp: , Rfl:    fluticasone furoate-vilanterol (BREO ELLIPTA) 200-25 MCG/ACT AEPB, Inhale 1 puff into the lungs daily., Disp: , Rfl:    fluvoxaMINE (LUVOX) 100 MG tablet, Take 250 mg by mouth at bedtime., Disp: , Rfl:    L-METHYLFOLATE-METHYLCOBALAMIN PO, Take 15 mg by mouth daily., Disp: , Rfl:    naproxen sodium (ALEVE) 220 MG tablet, Take 220 mg by mouth daily as needed., Disp: , Rfl:    Probiotic Product (PROBIOTIC PO), Take by mouth daily., Disp: , Rfl:    vitamin C (ASCORBIC ACID) 500 MG tablet, Take 500 mg by mouth at bedtime., Disp: , Rfl:   Social History: Social History   Tobacco Use   Smoking status: Never   Smokeless tobacco: Never  Vaping Use   Vaping status: Never Used  Substance Use Topics   Alcohol use: Never   Drug use: Yes    Types: Benzodiazepines    Comment: on taper    Family Medical History: No family history on file.  Physical Examination: Vitals:   07/07/24 0957  BP: (!) 146/94    General: Patient is in no apparent distress. Attention to examination is appropriate.  Neck:   Supple.  Full range of motion.  Respiratory: Patient is breathing without any difficulty.   NEUROLOGICAL:     Awake, alert, oriented to person, place, and time.  Speech is clear and fluent.   Cranial Nerves: Pupils equal round and reactive to light.  Facial tone is symmetric.  Facial sensation is symmetric. Shoulder shrug is symmetric. Tongue protrusion is midline.  There is no pronator drift.  Strength: Side Biceps Triceps Deltoid Interossei Grip Wrist Ext. Wrist Flex.  R 5 5 5 5 5 5 5   L 5 5 5 5 5 5 5    Side Iliopsoas Quads Hamstring PF DF EHL  R 5 5 5 5 5 5   L 5 5 5  5 5 5    Reflexes are 1+ and symmetric at the biceps, triceps, brachioradialis, patella and achilles.   Hoffman's is absent.   Bilateral upper and lower extremity sensation is intact to light touch.    No evidence of dysmetria noted.  +TTP R SI joint No TTP L SI joint   Medical Decision Making  Imaging: None to review  I have personally reviewed the images and agree with the above interpretation.  Assessment and Plan: Ms. Sedlak is a pleasant 38 y.o. female with   Chronic neck pain Chronic neck pain for ten years, possibly related to migraines, without significant radicular symptoms.  - Refer to physical therapy for neck pain management. - Schedule follow-up phone call in six weeks to assess progress with physical therapy. - Consider imaging if physical therapy does not improve symptoms.  Right sacroiliac  joint pain Chronic right sacroiliac joint pain with previous cortisone injections in 2020. She cannot tolerate steroids. Imaging not typically performed as it does not change management. Injections followed by potential sacroiliac fusion if significant relief from injections. - Refer to physical therapy for sacroiliac joint pain management. - Consider referral to a pain clinic for comprehensive pain management. - Discuss potential referral to a rheumatologist if inflammatory arthritis is suspected.  I will refer her to a pain psychologist at [facility].  After she start physical therapy, I would like to have a telephone visit with her in approximately 6 weeks.  That we will determine the utility of pursuing imaging.  I spent a total of 45 minutes in this patient's care today. This time was spent reviewing pertinent records including imaging studies, obtaining and confirming history, performing a directed evaluation, formulating and discussing my recommendations, and documenting the visit within the medical record.     Thank you for involving me in the care of this patient.       Savanah Bayles K. Clois MD, [facility] Neurosurgery

## 2024-07-07 ENCOUNTER — Encounter: Payer: Self-pay | Admitting: Neurosurgery

## 2024-07-07 ENCOUNTER — Ambulatory Visit (INDEPENDENT_AMBULATORY_CARE_PROVIDER_SITE_OTHER): Admitting: Neurosurgery

## 2024-07-07 ENCOUNTER — Telehealth: Payer: Self-pay

## 2024-07-07 VITALS — BP 146/94 | Ht 63.75 in | Wt 181.4 lb

## 2024-07-07 DIAGNOSIS — M533 Sacrococcygeal disorders, not elsewhere classified: Secondary | ICD-10-CM | POA: Diagnosis not present

## 2024-07-07 DIAGNOSIS — M542 Cervicalgia: Secondary | ICD-10-CM

## 2024-07-07 DIAGNOSIS — G43809 Other migraine, not intractable, without status migrainosus: Secondary | ICD-10-CM

## 2024-07-07 DIAGNOSIS — G8929 Other chronic pain: Secondary | ICD-10-CM

## 2024-07-07 DIAGNOSIS — M545 Low back pain, unspecified: Secondary | ICD-10-CM

## 2024-07-07 NOTE — Telephone Encounter (Signed)
-----   Message from [facility] sent at 07/07/2024  5:51 PM EDT ----- Regarding: RE: psychology [place] ----- Message ----- From: Lyndee Hait D Sent: 07/07/2024   2:29 PM EDT To: Reeves Daisy, MD Subject: psychology                                     Hey,  She is unable to go to [place] with [facility], they only accept in-house referrals.  She said you mentioned someone in [place] that she would like to try, do you remember the name?

## 2024-07-07 NOTE — Addendum Note (Signed)
 Addended by: Tommaso Cavitt on: 07/07/2024 07:34 PM   Modules accepted: Orders

## 2024-07-07 NOTE — Telephone Encounter (Signed)
 I changed the referral to Dr Norleen Asa in [facility] [address] [place], [place] [address] [phone]

## 2024-07-08 NOTE — Telephone Encounter (Signed)
 When I spoke with the patient yesterday, she wanted to get information about the office first. Sent patient a mychart message in regards to this request.

## 2024-08-25 ENCOUNTER — Telehealth: Admitting: Neurosurgery
